# Patient Record
Sex: Male | Born: 2000 | Race: White | Hispanic: No | Marital: Single
Health system: Southern US, Community
[De-identification: ages and names within clinical notes are randomized; demographics above are authoritative.]

## PROBLEM LIST (undated history)

## (undated) DIAGNOSIS — F909 Attention-deficit hyperactivity disorder, unspecified type: Secondary | ICD-10-CM

## (undated) DIAGNOSIS — T7840XA Allergy, unspecified, initial encounter: Secondary | ICD-10-CM

## (undated) HISTORY — DX: Allergy, unspecified, initial encounter: T78.40XA

## (undated) HISTORY — PX: TYMPANOSTOMY TUBE PLACEMENT: SHX32

## (undated) HISTORY — DX: Attention-deficit hyperactivity disorder, unspecified type: F90.9

---
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
## 2022-01-17 NOTE — Telephone Encounter (Signed)
Opened in error

## 2022-02-28 ENCOUNTER — Ambulatory Visit
Admission: RE | Admit: 2022-02-28 | Discharge: 2022-02-28 | Disposition: A | Discharge status: Home / Self Care | Payer: Medicaid Other | Source: Ambulatory Visit | Attending: Physician Assistant | Admitting: Physician Assistant

## 2022-02-28 VITALS — BP 135/91 | HR 61 | Temp 97.8°F | Resp 17

## 2022-02-28 DIAGNOSIS — J029 Acute pharyngitis, unspecified: Secondary | ICD-10-CM | POA: Insufficient documentation

## 2022-02-28 LAB — POCT RAPID STREP A (OFFICE): Rapid Strep A Screen: NEGATIVE

## 2022-02-28 NOTE — ED Provider Notes (Signed)
[facility]    CSN: [id number] Arrival date & time: 02/28/22  1045      History   Chief Complaint Chief Complaint  Patient presents with   Sore Throat    Headache, fever, fatigue - Entered by patient   Headache   Chills    HPI Bradley Andersen is a 21 y.o. male.   Patient here today for evaluation of headache, chills and sore throat that started yesterday.  He has had some fatigue as well.  He reports he may have had a fever initially but this is resolved.  He has been taking Tylenol which has seemed to be helpful.  He denies any cough or congestion.  He has not had any nausea, vomiting or diarrhea.  The history is provided by the patient.  Sore Throat Pertinent negatives include no abdominal pain and no shortness of breath.    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   Past Surgical History:  Procedure Laterality Date   TYMPANOSTOMY TUBE PLACEMENT         Home Medications    Prior to Admission medications   Medication Sig Start Date End Date Taking? Authorizing Provider  lisdexamfetamine (VYVANSE) 20 MG capsule Take 1 capsule (20 mg total) by mouth daily. 08/03/20   [name], MD    Family History Family History  Problem Relation Age of Onset   Depression Mother    ADD / ADHD Brother    Depression Maternal Grandmother     Social History Social History   Tobacco Use   Smoking status: Passive Smoke Exposure - Never Smoker   Smokeless tobacco: Never  Vaping Use   Vaping Use: Never used  Substance Use Topics   Alcohol use: No   Drug use: Never     Allergies   Patient has no known allergies.   Review of Systems Review of Systems  Constitutional:  Positive for chills. Negative for fever.  HENT:  Positive for sore throat. Negative for congestion and ear  pain.   Eyes:  Negative for discharge and redness.  Respiratory:  Negative for cough and shortness of breath.   Gastrointestinal:  Negative for abdominal pain, diarrhea, nausea and vomiting.     Physical Exam Triage Vital Signs ED Triage Vitals  Enc Vitals Group     BP      Pulse      Resp      Temp      Temp src      SpO2      Weight      Height      Head Circumference      Peak Flow      Pain Score      Pain Loc      Pain Edu?      Excl. in GC?    No data found.  Updated Vital Signs BP (!) 135/91 (BP Location: Left Arm)   Pulse 61   Temp 97.8 F (36.6 C) (Oral)   Resp 17   SpO2 97%      Physical Exam Vitals and nursing note reviewed.  Constitutional:      General: He is not in acute distress.    Appearance: He is well-developed. He is not ill-appearing.  HENT:  Head: Normocephalic and atraumatic.     Nose: No congestion.     Mouth/Throat:     Mouth: Mucous membranes are moist.     Pharynx: Posterior oropharyngeal erythema present. No oropharyngeal exudate.     Tonsils: 0 on the right. 0 on the left.  Eyes:     Conjunctiva/sclera: Conjunctivae normal.  Cardiovascular:     Rate and Rhythm: Normal rate and regular rhythm.     Heart sounds: Normal heart sounds. No murmur heard. Pulmonary:     Effort: Pulmonary effort is normal. No respiratory distress.     Breath sounds: Normal breath sounds. No wheezing, rhonchi or rales.  Skin:    General: Skin is warm and dry.  Neurological:     Mental Status: He is alert.  Psychiatric:        Mood and Affect: Mood normal.        Behavior: Behavior normal.      UC Treatments / Results  Labs (all labs ordered are listed, but only abnormal results are displayed) Labs Reviewed  CULTURE, GROUP A STREP ([facility])  NOVEL CORONAVIRUS, NAA  POCT RAPID STREP A (OFFICE)    EKG   Radiology No results found.  Procedures Procedures (including critical care time)  Medications Ordered in UC Medications - No data  to display  Initial Impression / Assessment and Plan / UC Course  I have reviewed the triage vital signs and the nursing notes.  Pertinent labs & imaging results that were available during my care of the patient were reviewed by me and considered in my medical decision making (see chart for details).    Strep test negative in office.  Will order throat culture as well as COVID screening.  Discussed possible viral etiology and recommended symptomatic treatment.  Encouraged follow-up if symptoms fail to improve or worsen.  Final Clinical Impressions(s) / UC Diagnoses   Final diagnoses:  Acute pharyngitis, unspecified etiology   Discharge Instructions   None    ED Prescriptions   None    PDMP not reviewed this encounter.   Tomi Bamberger, PA-C 02/28/22 1123

## 2022-02-28 NOTE — ED Triage Notes (Signed)
Pt presents with headache, chills, sore throat, and fatigue since yesterday.

## 2022-03-01 LAB — NOVEL CORONAVIRUS, NAA: SARS-CoV-2, NAA: DETECTED — AB

## 2022-03-03 LAB — CULTURE, GROUP A STREP (THRC)
